# Patient Record
Sex: Male | Born: 1990 | Race: Black or African American | Hispanic: No | Marital: Single | Smoking: Current every day smoker
Health system: Southern US, Community
[De-identification: ages and names within clinical notes are randomized; demographics above are authoritative.]

---
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
## 2012-03-27 ENCOUNTER — Emergency Department (HOSPITAL_COMMUNITY): Payer: Self-pay

## 2012-03-27 ENCOUNTER — Encounter (HOSPITAL_COMMUNITY): Payer: Self-pay | Admitting: *Deleted

## 2012-03-27 ENCOUNTER — Emergency Department (HOSPITAL_COMMUNITY)
Admission: EM | Admit: 2012-03-27 | Discharge: 2012-03-27 | Disposition: A | Discharge status: Home / Self Care | Payer: Self-pay | Attending: Emergency Medicine | Admitting: Emergency Medicine

## 2012-03-27 DIAGNOSIS — R0602 Shortness of breath: Secondary | ICD-10-CM | POA: Insufficient documentation

## 2012-03-27 MED ORDER — ALBUTEROL SULFATE (5 MG/ML) 0.5% IN NEBU
2.5000 mg | INHALATION_SOLUTION | Freq: Once | RESPIRATORY_TRACT | Status: AC
  Administered 2012-03-27: 2.5 mg via RESPIRATORY_TRACT
  Filled 2012-03-27: qty 0.5

## 2012-03-27 MED ORDER — PREDNISONE 10 MG PO TABS: 20.0000 mg | ORAL_TABLET | Freq: Every day | ORAL | Status: DC

## 2012-03-27 NOTE — ED Provider Notes (Signed)
History     CSN: [id number]  Arrival date & time 03/27/12  0530   First MD Initiated Contact with Patient 03/27/12 0547      Chief Complaint  Patient presents with  . Shortness of Breath    (Consider location/radiation/quality/duration/timing/severity/associated sxs/prior treatment) HPI Nikoli L Ketelsen is a 21 y.o. male who presents to the Emergency Department complaining of  Shortness of breath that has been present for several days and is worse with exertion. He denies fever, chills, nausea, vomiting, cough. He reports he has scoliosis and has not followed up with his doctor. He feels his shortness of breath is from the scoliosis.  History reviewed. No pertinent past medical history.  History reviewed. No pertinent past surgical history.  History reviewed. No pertinent family history.  History  Substance Use Topics  . Smoking status: Not on file  . Smokeless tobacco: Not on file  . Alcohol Use: Yes      Review of Systems  Constitutional: Negative for fever.       10 Systems reviewed and are negative for acute change except as noted in the HPI.  HENT: Negative for congestion.   Eyes: Negative for discharge and redness.  Respiratory: Positive for shortness of breath. Negative for cough.   Cardiovascular: Negative for chest pain.  Gastrointestinal: Negative for vomiting and abdominal pain.  Musculoskeletal: Negative for back pain.  Skin: Negative for rash.  Neurological: Negative for syncope, numbness and headaches.  Psychiatric/Behavioral:       No behavior change.    Allergies  Review of patient's allergies indicates no known allergies.  Home Medications  No current outpatient prescriptions on file.  BP 114/77  Pulse 80  Temp 98.1 F (36.7 C) (Oral)  Resp 20  Ht 5\' 10"  (1.778 m)  Wt 130 lb (58.968 kg)  BMI 18.65 kg/m2  SpO2 97%  Physical Exam  Nursing note and vitals reviewed. Constitutional:       Awake, alert, nontoxic appearance.  HENT:    Head: Atraumatic.  Eyes: Right eye exhibits no discharge. Left eye exhibits no discharge.  Neck: Neck supple.  Pulmonary/Chest: Effort normal and breath sounds normal. No respiratory distress. He has no wheezes. He has no rales. He exhibits no tenderness.       Pulse ox on RA is 97-99%.  Abdominal: Soft. There is no tenderness. There is no rebound.  Musculoskeletal: He exhibits no tenderness.       Baseline ROM, no obvious new focal weakness.Scoliosis  Neurological:       Mental status and motor strength appears baseline for patient and situation.  Skin: No rash noted.  Psychiatric: He has a normal mood and affect.    ED Course  Procedures (including critical care time) Dg Chest 2 View  03/27/2012  *RADIOLOGY REPORT*  Clinical Data: Shortness of breath  CHEST - 2 VIEW  Comparison: None.  Findings: Normal cardiac silhouette and mediastinal contours.  No focal parenchymal opacities.  No pleural effusion or pneumothorax. Mild scoliotic curvature of the thoracolumbar spine.  No acute osseous abnormalities.  IMPRESSION: No acute cardiopulmonary disease.  Original Report Authenticated By: Waynard Reeds, M.D.    MDM  Patient here with episodes of shortness of breath without cough, fever, chest pain. Given albuterol treatment with improvement. Chest xray without  acute findings. Pt stable in ED with no significant deterioration in condition.The patient appears reasonably screened and/or stabilized for discharge and I doubt any other medical condition or other [facility] requiring further  screening, evaluation, or treatment in the ED at this time prior to discharge.  MDM Reviewed: nursing note and vitals Interpretation: x-ray           Nicoletta Dress. Colon Branch, MD 03/27/12 9604

## 2012-03-27 NOTE — ED Notes (Signed)
Patient with no complaints at this time. Respirations even and unlabored. Skin warm/dry. Discharge instructions reviewed with patient at this time. Patient given opportunity to voice concerns/ask questions. Patient discharged at this time and left Emergency Department with steady gait.   

## 2012-03-27 NOTE — Discharge Instructions (Signed)
Your xray did not show a bronchitis or pneumonia. Take all of the prednisone. Follow up with your doctor.   Shortness of Breath Shortness of breath (dyspnea) is the feeling of uneasy breathing. Shortness of breath needs care right away. HOME CARE   Do not smoke.   Avoid being around chemicals that may bother your breathing (such as paint fumes or dust).   Rest as needed. Slowly begin your usual activities.   Only take medicine as told by your doctor. Inhaled medicines or oxygen might be part of your treatment.   Follow up with your doctor as told. Waiting to do so or failure to follow up could result in worsening your condition and possible disability or death.   Understand what to do or who to call if your shortness of breath gets worse.  GET HELP RIGHT AWAY IF:   You get pain in your chest, shoulders, belly (abdomen), or jaw.   You cannot stop coughing or you start wheezing.   You cough up blood or thick mucus.   You can only speak with short words.   You have a fever.   You feel your heart racing or skipping beats.   You are not breathing better when you stop and rest.   Your condition does not improve in the time expected.   You have problems with medicines.  MAKE SURE YOU:  Understand these instructions.   Will watch your condition.   Will get help right away if you are not doing well or get worse.  Document Released: 03/16/2008 Document Revised: 09/17/2011 Document Reviewed: 08/14/2009 [facility] Patient Information 2012 [place], [place].

## 2012-03-27 NOTE — ED Notes (Signed)
Patient lying on right side in bed with eyes closed. NAD noted.

## 2012-11-01 IMAGING — CR DG CHEST 2V
2 series · 2 of 2 positions shown · non-contrast
Comparison: None.

CLINICAL DATA: Shortness of breath

CHEST - 2 VIEW

[view not recorded (1 of 2)]
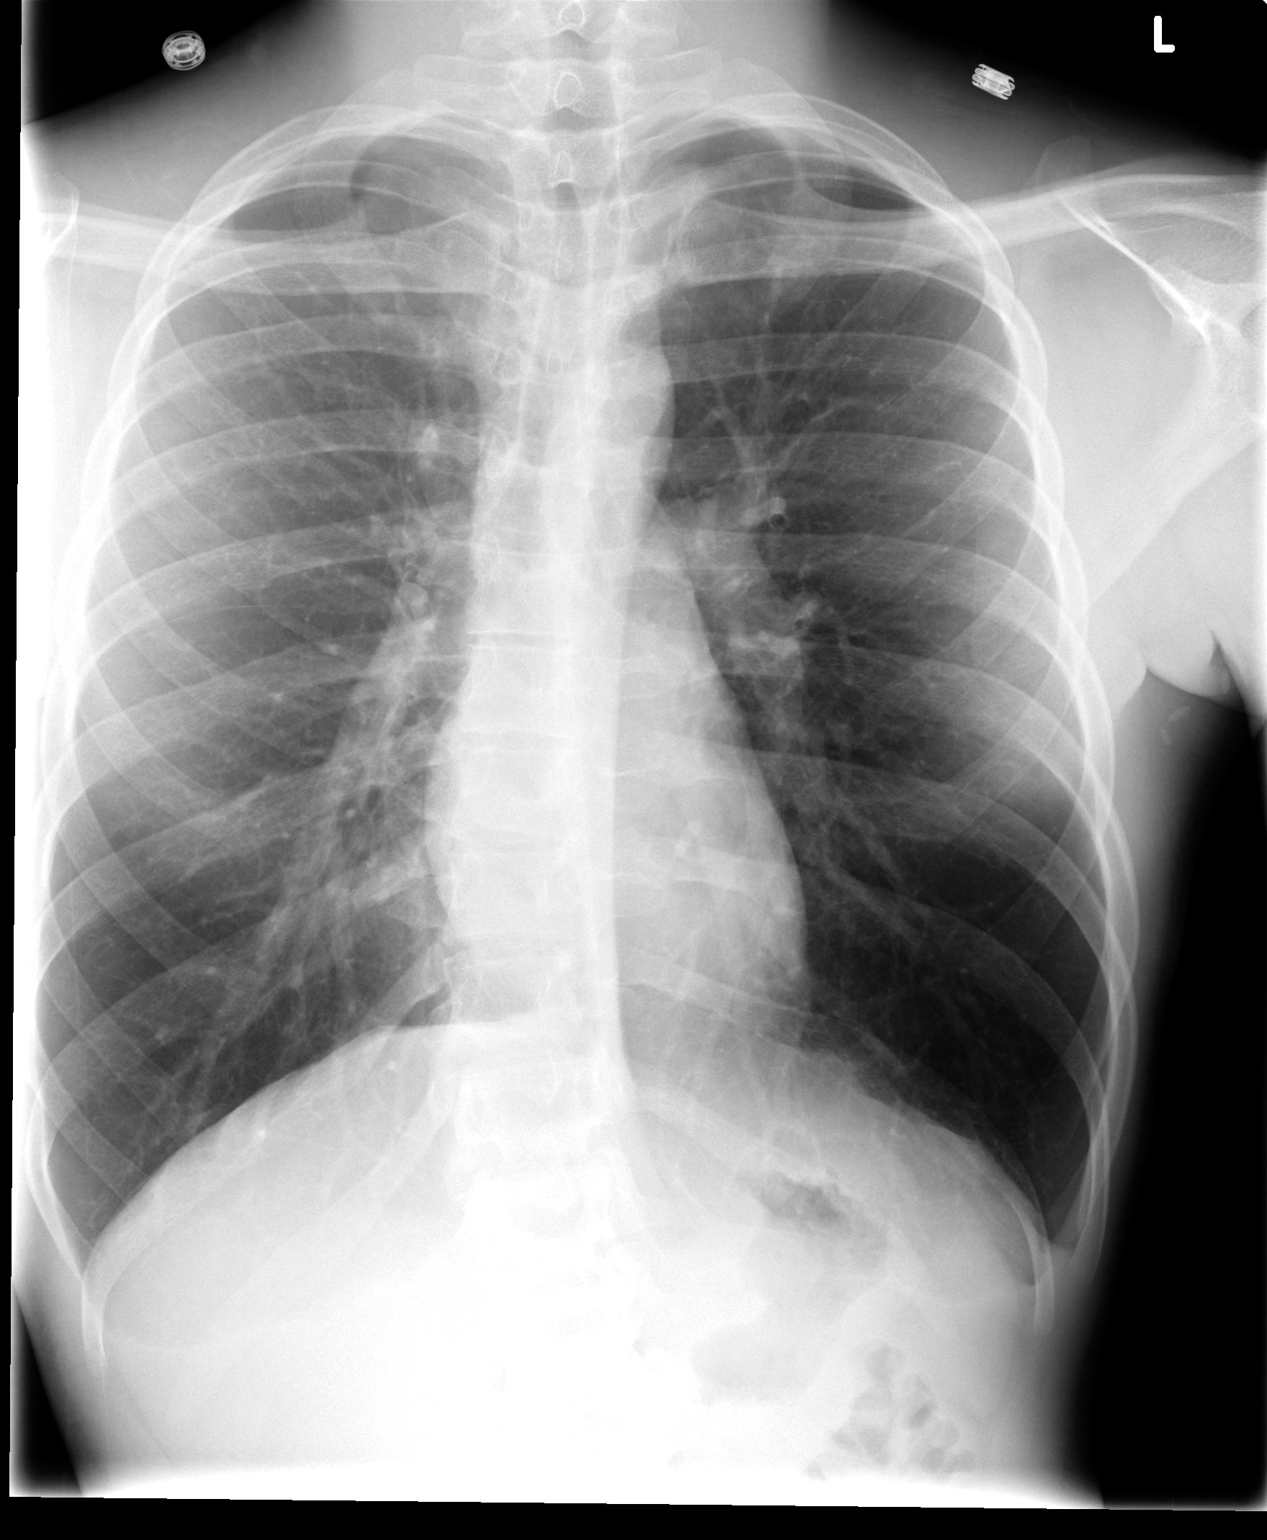

[view not recorded (2 of 2)]
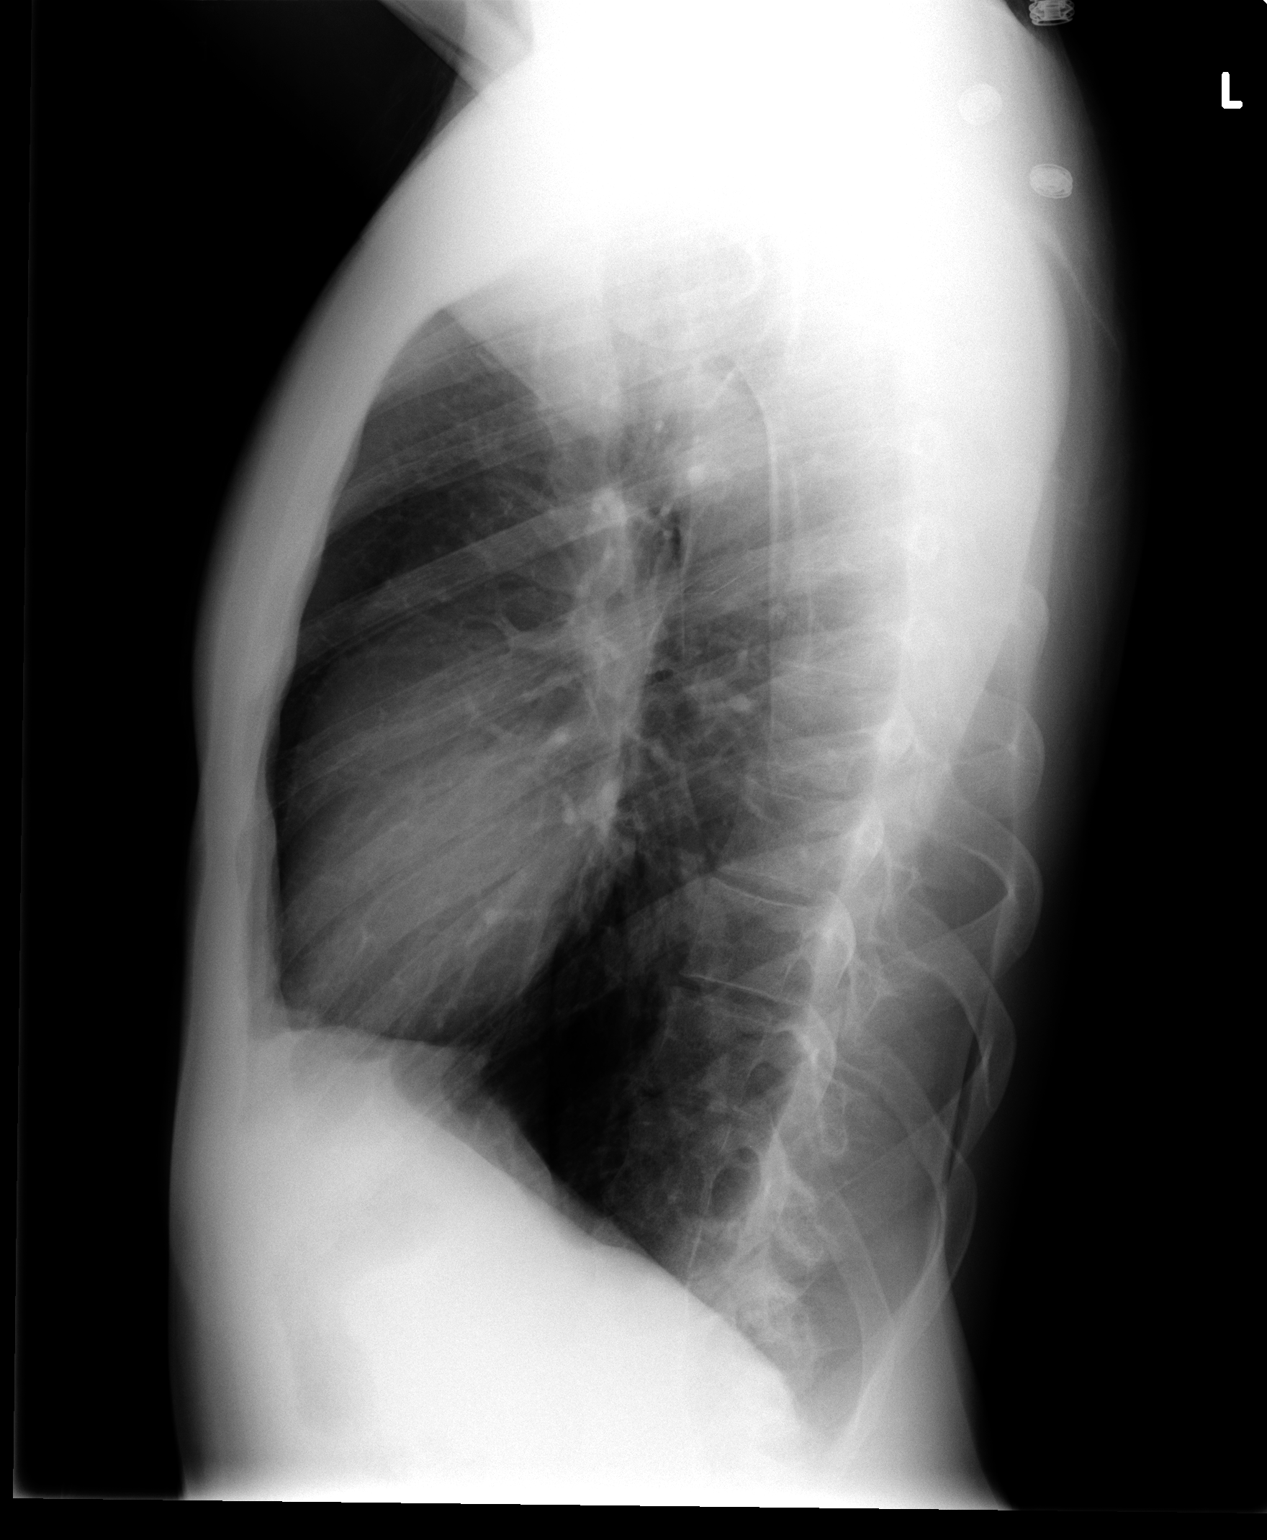

[2 of 2 positions shown; findings below may reference images not displayed]

FINDINGS: Normal cardiac silhouette and mediastinal contours.  No
focal parenchymal opacities.  No pleural effusion or pneumothorax.
Mild scoliotic curvature of the thoracolumbar spine.  No acute
osseous abnormalities.
IMPRESSION: No acute cardiopulmonary disease.

## 2016-08-05 ENCOUNTER — Encounter (HOSPITAL_COMMUNITY): Payer: Self-pay | Admitting: Emergency Medicine

## 2016-08-05 ENCOUNTER — Emergency Department (HOSPITAL_COMMUNITY)
Admission: EM | Admit: 2016-08-05 | Discharge: 2016-08-05 | Disposition: A | Discharge status: Home / Self Care | Payer: Self-pay | Attending: Emergency Medicine | Admitting: Emergency Medicine

## 2016-08-05 DIAGNOSIS — F17200 Nicotine dependence, unspecified, uncomplicated: Secondary | ICD-10-CM | POA: Insufficient documentation

## 2016-08-05 DIAGNOSIS — X500XXA Overexertion from strenuous movement or load, initial encounter: Secondary | ICD-10-CM | POA: Insufficient documentation

## 2016-08-05 DIAGNOSIS — Y990 Civilian activity done for income or pay: Secondary | ICD-10-CM | POA: Insufficient documentation

## 2016-08-05 DIAGNOSIS — M545 Low back pain: Secondary | ICD-10-CM | POA: Insufficient documentation

## 2016-08-05 DIAGNOSIS — M5489 Other dorsalgia: Secondary | ICD-10-CM

## 2016-08-05 DIAGNOSIS — Y93F2 Activity, caregiving, lifting: Secondary | ICD-10-CM | POA: Insufficient documentation

## 2016-08-05 DIAGNOSIS — Y929 Unspecified place or not applicable: Secondary | ICD-10-CM | POA: Insufficient documentation

## 2016-08-05 MED ORDER — KETOROLAC TROMETHAMINE 30 MG/ML IJ SOLN
30.0000 mg | Freq: Once | INTRAMUSCULAR | Status: AC
  Administered 2016-08-05: 30 mg via INTRAMUSCULAR
  Filled 2016-08-05: qty 1

## 2016-08-05 MED ORDER — IBUPROFEN 600 MG PO TABS: 600.0000 mg | ORAL_TABLET | Freq: Four times a day (QID) | ORAL | 0 refills | Status: DC | PRN

## 2016-08-05 NOTE — Discharge Instructions (Signed)
You are given resources for mental health and primary care provider as needed.  Your back pain is likely due to muscle strain from heavy lifting and exertional activity. Ice or apply heat packs as needed. Take ibuprofen for pain. Return without fail for worsening symptoms, including new numbness or weakness, inability to walk, or any other symptoms concerning to you.  Please also return for recurrent thoughts of wanting to hurt yourself or others, or feeling unsafe.

## 2016-08-05 NOTE — ED Provider Notes (Signed)
[facility]-EMERGENCY DEPT Provider Note   CSN: [id number] Arrival date & time: 08/05/16  1226  By signing my name below, I, Majel HomerPeyton Lee, attest that this documentation has been prepared under the direction and in the presence of Lavera Guiseana Duo Tadeusz Stahl, MD . Electronically Signed: Majel HomerPeyton Lee, Scribe. 08/05/2016. 1:50 PM.  History   Chief Complaint Chief Complaint  Patient presents with  . Back Pain   The history is provided by the patient and a friend. No language interpreter was used.   HPI Comments: Gordon Lamb is a 25 y.o. male who presents to the Emergency Department complaining of gradually worsening, right sided lower back pain that began a few days ago and worsened this morning. Per friend, pt works at a [employer] and performs a lot of heavy lifting and bending at work. She notes pt came home from work last night and "couldn't move his back" due to pain; she states pt could not move this morning either which is why he visited the ED. He states he has not taken any medication for his pain. Pt reports he has experienced chronic back pain since he was 25 years old when he was diagnosed with scoliosis. He denies fever, nausea, vomiting, chest pain, shortness of breath, numbness, urinary or bowel incontinence, urinary retention, urgency or frequency.   Pt reports hx of depression and serious SI approximately one month ago. He states he has not had similar thoughts for the past few weeks and does not have a specific plan of carrying this out. He states he does not currently see a mental health counselor for his symptoms; per friend, "pt doesn't like doctors and refuses to go."   History reviewed. No pertinent past medical history. There are no active problems to display for this patient.  History reviewed. No pertinent surgical history.  Home Medications    Prior to Admission medications   Medication Sig Start Date End Date Taking? Authorizing Provider  ibuprofen (ADVIL,MOTRIN) 600 MG  tablet Take 1 tablet (600 mg total) by mouth every 6 (six) hours as needed. 08/05/16   Lavera Guiseana Duo Kayley Zeiders, MD  predniSONE (DELTASONE) 10 MG tablet Take 2 tablets (20 mg total) by mouth daily. 03/27/12   Annamarie Dawleyerry S Strand, MD    Family History History reviewed. No pertinent family history.  Social History Social History  Substance Use Topics  . Smoking status: Current Every Day Smoker    Packs/day: 0.50  . Smokeless tobacco: Never Used  . Alcohol use Yes     Comment: occasionally   Allergies   Review of patient's allergies indicates no known allergies.  Review of Systems Review of Systems  Constitutional: Negative for fever.  Respiratory: Negative for shortness of breath.   Cardiovascular: Negative for chest pain.  Gastrointestinal: Negative for nausea and vomiting.  Genitourinary: Negative for decreased urine volume, frequency and urgency.  Musculoskeletal: Positive for back pain.  Neurological: Negative for numbness.  Psychiatric/Behavioral: Negative for suicidal ideas.  All other systems reviewed and are negative.  Physical Exam Updated Vital Signs BP 117/70 (BP Location: Left Arm)   Pulse 75   Resp 16   Ht 5\' 10"  (1.778 m)   Wt 140 lb (63.5 kg)   SpO2 100%   BMI 20.09 kg/m   Physical Exam Physical Exam  Nursing note and vitals reviewed. Constitutional: Well developed, well nourished, non-toxic, and in no acute distress Head: Normocephalic and atraumatic.  Mouth/Throat: Oropharynx is clear and moist.  Neck: Normal range of motion. Neck supple.  Cardiovascular: Normal rate and regular rhythm.   Pulmonary/Chest: Effort normal and breath sounds normal.  Abdominal: Soft. There is no tenderness. There is no rebound and no guarding.  Musculoskeletal: Normal range of motion.  Neurological: Alert, no facial droop, fluent speech, moves all extremities symmetrically. Sensation to light touch in BLE. +2 patellar and Achilles reflexes. Full strength in bilateral knee and hip with  flexion and extension and ankle dorsi and plantar flexion.  Skin: Skin is warm and dry.  Psychiatric: Cooperative  ED Treatments / Results  Labs (all labs ordered are listed, but only abnormal results are displayed) Labs Reviewed - No data to display  EKG  EKG Interpretation None       Radiology No results found.  Procedures Procedures (including critical care time)  Medications Ordered in ED Medications  ketorolac (TORADOL) 30 MG/ML injection 30 mg (30 mg Intramuscular Given 08/05/16 1427)    DIAGNOSTIC STUDIES:  Oxygen Saturation is 100% on RA, normal by my interpretation.    COORDINATION OF CARE:  1:42 PM Discussed treatment plan with pt at bedside and pt agreed to plan.  Initial Impression / Assessment and Plan / ED Course  I have reviewed the triage vital signs and the nursing notes.  Pertinent labs & imaging results that were available during my care of the patient were reviewed by me and considered in my medical decision making (see chart for details).  Clinical Course   25 year old male who presents with right sided back pain after heavy lifting at work.Presentation seems consistent with likely back strain. He is reproducible tenderness to palpation over the right paraspinal muscles. Abdomen is benign and no urinary symptoms. I do not suspect acute intra-abdominal or retroperitoneal process at this time. He is neurologically intact, without concerning features of his history or exam that would be concerning for spine trauma, infection, malignancy, or acute spinal cord process. Given toradol for pain. Discussed supportive care with NSAIDs, ice/heat packs, and avoidance of bedrest.  In regards to his depression, no current SI/HI, VAH and not responding to internal stimuli. Without evidence of acute psychosis. Denies any current serious depression. Does not require acute psych intervention or treatment on today's visit. Given outpatient mental health resources and  discussed ED return for SI/HI or feeling unsafe.   The patient appears reasonably screened and/or stabilized for discharge and I doubt any other medical condition or other [facility] requiring further screening, evaluation, or treatment in the ED at this time prior to discharge.  Strict return and follow-up instructions reviewed. He expressed understanding of all discharge instructions and felt comfortable with the plan of care.   I personally performed the services described in this documentation, which was scribed in my presence. The recorded information has been reviewed and is accurate.   Final Clinical Impressions(s) / ED Diagnoses   Final diagnoses:  Pain in right paraspinal region    New Prescriptions Discharge Medication List as of 08/05/2016  2:42 PM    START taking these medications   Details  ibuprofen (ADVIL,MOTRIN) 600 MG tablet Take 1 tablet (600 mg total) by mouth every 6 (six) hours as needed., Starting Wed 08/05/2016, Print         Lavera Guise, MD 08/05/16 [phone]

## 2016-08-05 NOTE — ED Triage Notes (Addendum)
Patient states he has MS and started having lower back pain and difficulty walking last night after work. States he does heavy lifting and bending at work. During triage, patient admits to having suicidal thoughts. States he has a long history of depression and had serious SI approximately one month ago. Denies plan at this time. Denies any serious SI within last 30 days.

## 2016-10-14 ENCOUNTER — Telehealth: Payer: Self-pay

## 2016-10-14 NOTE — Telephone Encounter (Signed)
Called pt to set up new pt appt with dr Delton Seenelson, voice mail not set up to leave message.

## 2017-11-24 ENCOUNTER — Encounter (HOSPITAL_COMMUNITY): Payer: Self-pay

## 2017-11-24 ENCOUNTER — Emergency Department (HOSPITAL_COMMUNITY)
Admission: EM | Admit: 2017-11-24 | Discharge: 2017-11-24 | Disposition: A | Discharge status: Home / Self Care | Payer: Self-pay | Attending: Emergency Medicine | Admitting: Emergency Medicine

## 2017-11-24 ENCOUNTER — Other Ambulatory Visit: Payer: Self-pay

## 2017-11-24 DIAGNOSIS — F17200 Nicotine dependence, unspecified, uncomplicated: Secondary | ICD-10-CM | POA: Insufficient documentation

## 2017-11-24 DIAGNOSIS — B349 Viral infection, unspecified: Secondary | ICD-10-CM | POA: Insufficient documentation

## 2017-11-24 MED ORDER — FLUTICASONE PROPIONATE 50 MCG/ACT NA SUSP: 2.0000 | Freq: Every day | NASAL | 0 refills | Status: DC

## 2017-11-24 NOTE — ED Provider Notes (Signed)
[facility]ENCY [facility]PART[facility] Provider Note   CSN: [id number] Arrival date & time: 11/24/17  1415     History   Chief Complaint Chief Complaint  Patient presents with  . Fever    HPI Gordon Lamb is a 27 y.o. male with no significant past medical history presents today for evaluation of subjective fevers and chills and mild nasal congestion which began yesterday.  Denies cough, sore throat, chest pain, shortness of breath, abdominal pain, nausea, or vomiting.  Has not taken his temperature but states he feels intermittently hot and cold. Endorses generalized myalgias.  Has had DayQuil and Mucinex with some relief of his symptoms.  He has known sick contacts with similar symptoms.  States he has been eating and drinking normally with good urine output.  The history is provided by the patient.    History reviewed. No pertinent past medical history.  There are no active problems to display for this patient.   History reviewed. No pertinent surgical history.     Home Medications    Prior to Admission medications   Medication Sig Start Date End Date Taking? Authorizing Provider  fluticasone (FLONASE) 50 MCG/ACT nasal spray Place 2 sprays into both nostrils daily. 11/24/17   Nguyen Butler A, PA-C  ibuprofen (ADVIL,MOTRIN) 600 MG tablet Take 1 tablet (600 mg total) by mouth every 6 (six) hours as needed. 08/05/16   Lavera GuiseLiu, Dana Duo, MD  predniSONE (DELTASONE) 10 MG tablet Take 2 tablets (20 mg total) by mouth daily. 03/27/12   Annamarie DawleyStrand, Terry S, MD    Family History No family history on file.  Social History Social History   Tobacco Use  . Smoking status: Current Every Day Smoker    Packs/day: 0.50  . Smokeless tobacco: Never Used  Substance Use Topics  . Alcohol use: Yes    Comment: occasionally  . Drug use: No     Allergies   Patient has no known allergies.   Review of Systems Review of Systems  Constitutional: Positive for chills and fever.  HENT: Positive  for congestion. Negative for sore throat.   Respiratory: Negative for cough and shortness of breath.   Cardiovascular: Negative for chest pain.  Gastrointestinal: Negative for diarrhea, nausea and vomiting.  Musculoskeletal: Positive for myalgias.  All other systems reviewed and are negative.    Physical Exam Updated Vital Signs BP 114/80   Pulse 98   Temp 99.1 F (37.3 C) (Oral)   Resp 18   Ht 5\' 10"  (1.778 m)   Wt 65.8 kg (145 lb)   SpO2 97%   BMI 20.81 kg/m   Physical Exam  Constitutional: He appears well-developed and well-nourished. No distress.  HENT:  Head: Normocephalic and atraumatic.  Right Ear: External ear normal.  Left Ear: External ear normal.  TMs without erythema or bulging bilaterally.  Nasal septum midline with mild mucosal edema bilaterally.  Posterior oropharynx with mild erythema but no tonsillar hypertrophy, exudates, or uvular deviation.  No trismus or sublingual abnormalities.  Eyes: Conjunctivae are normal. Right eye exhibits no discharge. Left eye exhibits no discharge.  Neck: Normal range of motion. Neck supple. No JVD present. No tracheal deviation present.  Cardiovascular: Normal rate, regular rhythm and normal heart sounds.  Pulmonary/Chest: Effort normal and breath sounds normal. No stridor. No respiratory distress. He has no wheezes. He has no rales.  Abdominal: Soft. Bowel sounds are normal. He exhibits no distension. There is no tenderness.  Musculoskeletal: Normal range of motion. He exhibits no  edema or tenderness.  Neurological: He is alert.  Skin: Skin is warm and dry. No erythema.  Psychiatric: He has a normal mood and affect. His behavior is normal.  Nursing note and vitals reviewed.    ED Treatments / Results  Labs (all labs ordered are listed, but only abnormal results are displayed) Labs Reviewed - No data to display  EKG  EKG Interpretation None       Radiology No results found.  Procedures Procedures (including  critical care time)  Medications Ordered in ED Medications - No data to display   Initial Impression / Assessment and Plan / ED Course  I have reviewed the triage vital signs and the nursing notes.  Pertinent labs & imaging results that were available during my care of the patient were reviewed by me and considered in my medical decision making (see chart for details).     Patient with nasal congestion and subjective fevers and chills since yesterday.  Afebrile, vital signs are stable in the ED.  Patient is nontoxic in appearance.  He has moist mucous membranes.  Lungs clear to auscultation bilaterally, doubt pneumonia or bronchitis.  No meningeal signs to suggest meningitis.  No sinus tenderness.  No evidence of strep pharyngitis or PTA.  History and physical examination consistent with viral URI.  Discussed that he is within the window to test for flu and potentially treat with Tamiflu if test is positive.  Discussed the risks and benefits of Tamiflu.  Patient elects to forego flu test and does not want Tamiflu treatment.  He prefers symptomatic treatment.  Discussed symptomatic treatment.  Discussed indications for return to the ED.  Recommend follow-up with primary care physician for reevaluation of symptoms. Pt verbalized understanding of and agreement with plan and is safe for discharge home at this time.  He has no complaints prior to discharge.  Final Clinical Impressions(s) / ED Diagnoses   Final diagnoses:  Viral illness    ED Discharge Orders        Ordered    fluticasone (FLONASE) 50 MCG/ACT nasal spray  Daily     11/24/17 1608       Jeanie Sewer, PA-C 11/24/17 1610    Doug Sou, MD 11/24/17 2324

## 2017-11-24 NOTE — ED Triage Notes (Signed)
Patient reports of fever x1 day. Denies cough/sore throat.

## 2017-11-24 NOTE — Discharge Instructions (Signed)
Drink plenty of clear fluids and get plenty of rest. Gargle warm salt water and spit it out for sore throat. May also use cough drops, warm teas, etc. Take flonase to decrease nasal congestion.  You may take over-the-counter allergy medication for nasal congestion and scratchy throat. Alternate 600 mg of ibuprofen and [phone] mg of Tylenol every 3 hours as needed for pain/fever. Do not exceed 4000 mg of Tylenol daily.   Followup with your primary care doctor in 5-7 days for recheck of ongoing symptoms. Return to emergency department for emergent changing or worsening of symptoms such as throat tightness, facial swelling, fever not controlled by ibuprofen or Tylenol,difficulty breathing, or chest pain.
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
## 2018-08-09 ENCOUNTER — Encounter (HOSPITAL_COMMUNITY): Payer: Self-pay | Admitting: Psychiatry

## 2018-08-09 ENCOUNTER — Ambulatory Visit (INDEPENDENT_AMBULATORY_CARE_PROVIDER_SITE_OTHER): Payer: No Typology Code available for payment source | Admitting: Psychiatry

## 2018-08-09 ENCOUNTER — Encounter (INDEPENDENT_AMBULATORY_CARE_PROVIDER_SITE_OTHER): Payer: Self-pay

## 2018-08-09 VITALS — BP 123/86 | HR 76 | Ht 70.0 in | Wt 165.0 lb

## 2018-08-09 DIAGNOSIS — F331 Major depressive disorder, recurrent, moderate: Secondary | ICD-10-CM | POA: Diagnosis not present

## 2018-08-09 DIAGNOSIS — F419 Anxiety disorder, unspecified: Secondary | ICD-10-CM | POA: Diagnosis not present

## 2018-08-09 DIAGNOSIS — F649 Gender identity disorder, unspecified: Secondary | ICD-10-CM

## 2018-08-09 DIAGNOSIS — G4700 Insomnia, unspecified: Secondary | ICD-10-CM | POA: Diagnosis not present

## 2018-08-09 MED ORDER — SERTRALINE HCL 50 MG PO TABS: ORAL_TABLET | ORAL | 0 refills | Status: DC
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
## 2018-08-09 NOTE — Patient Instructions (Addendum)
1. Start sertraline 25 mg daily for one week, 50 mg daily  2. Check TSH (ask your primary care doctor) 3. Return to clinic in one month for 30 mins  CONTACT INFORMATION  What to do if you need to get in touch with someone regarding a psychiatric issue:  1. EMERGENCY: For psychiatric emergencies (if you are suicidal or if there are any other safety issues) call 911 and/or go to your nearest Emergency Room immediately.   2. IF YOU NEED SOMEONE TO TALK TO RIGHT NOW: Given my clinical responsibilities, I may not be able to speak with you over the phone for a prolonged period of time.  a. You may always call The National Suicide Prevention Lifeline at 1-800-273-TALK 970-375-7558).  b. Your county of residence will also have local crisis services. For [facility]: [facility] at [phone] (24 Hour Crisis Hotline)

## 2018-08-16 ENCOUNTER — Encounter: Payer: Self-pay | Admitting: Orthopedic Surgery
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
## 2018-09-13 ENCOUNTER — Ambulatory Visit (INDEPENDENT_AMBULATORY_CARE_PROVIDER_SITE_OTHER): Payer: No Typology Code available for payment source | Admitting: Psychiatry

## 2018-09-13 VITALS — BP 120/82 | HR 80 | Ht 70.0 in | Wt 168.0 lb

## 2018-09-13 DIAGNOSIS — F649 Gender identity disorder, unspecified: Secondary | ICD-10-CM

## 2018-09-13 DIAGNOSIS — F330 Major depressive disorder, recurrent, mild: Secondary | ICD-10-CM

## 2018-09-13 MED ORDER — SERTRALINE HCL 100 MG PO TABS: 100.0000 mg | ORAL_TABLET | Freq: Every day | ORAL | 0 refills | Status: DC

## 2018-09-13 NOTE — Patient Instructions (Signed)
1. Increase sertraline 100 mg daily  2. Check TSH  3. Return to clinic in three months for 30 mins 4. Referral to therapy

## 2018-09-14 LAB — TSH: TSH: 2.08 m[IU]/L (ref 0.40–4.50)

## 2018-10-21 ENCOUNTER — Emergency Department (HOSPITAL_COMMUNITY)
Admission: EM | Admit: 2018-10-21 | Discharge: 2018-10-21 | Disposition: A | Discharge status: Home / Self Care | Payer: No Typology Code available for payment source | Attending: Emergency Medicine | Admitting: Emergency Medicine

## 2018-10-21 ENCOUNTER — Other Ambulatory Visit: Payer: Self-pay

## 2018-10-21 ENCOUNTER — Encounter (HOSPITAL_COMMUNITY): Payer: Self-pay | Admitting: *Deleted

## 2018-10-21 DIAGNOSIS — Z79899 Other long term (current) drug therapy: Secondary | ICD-10-CM | POA: Insufficient documentation

## 2018-10-21 DIAGNOSIS — F17200 Nicotine dependence, unspecified, uncomplicated: Secondary | ICD-10-CM | POA: Diagnosis not present

## 2018-10-21 DIAGNOSIS — K0889 Other specified disorders of teeth and supporting structures: Secondary | ICD-10-CM | POA: Diagnosis present

## 2018-10-21 DIAGNOSIS — F329 Major depressive disorder, single episode, unspecified: Secondary | ICD-10-CM | POA: Insufficient documentation

## 2018-10-21 DIAGNOSIS — K029 Dental caries, unspecified: Secondary | ICD-10-CM

## 2018-10-21 MED ORDER — PENICILLIN V POTASSIUM 500 MG PO TABS: 500.0000 mg | ORAL_TABLET | Freq: Four times a day (QID) | ORAL | 0 refills | Status: AC

## 2018-10-21 MED ORDER — IBUPROFEN 600 MG PO TABS: 600.0000 mg | ORAL_TABLET | Freq: Four times a day (QID) | ORAL | 0 refills | Status: AC | PRN

## 2018-10-21 NOTE — Discharge Instructions (Signed)
Take Penicillin 4 times daily for the next week Take Ibuprofen as directed for the next week Please follow up with a dentist - a referral has been provided as well as a Writer guide which has several low cost providers listed Return if worsening

## 2018-10-21 NOTE — ED Triage Notes (Signed)
Pt c/o toothache on upper and lower right side x couple of weeks, worsening today.

## 2018-10-21 NOTE — ED Provider Notes (Signed)
[facility] EMERGENCY DEPARTMENT Provider Note   CSN: [id number] Arrival date & time: 10/21/18  1541     History   Chief Complaint Chief Complaint  Patient presents with  . Dental Pain    HPI Gordon Lamb is a 28 y.o. male who presents with dental pain.  Patient states that he has had severe dental pain over the right upper and lower teeth for the past day but has had issues with the teeth for weeks.  He saw a dentist on Monday for a cleaning however they were not really able to complete a full work-up because he did not have the ability to pay for x-rays and any procedures.  He has been taking over-the-counter medicine without significant relief.  He denies fever or significant jaw swelling.  He has been able to swallow.  HPI  History reviewed. No pertinent past medical history.  Patient Active Problem List   Diagnosis Date Noted  . MDD (major depressive disorder), recurrent episode, moderate (HCC) 08/09/2018  . Gender dysphoria 08/09/2018    History reviewed. No pertinent surgical history.      Home Medications    Prior to Admission medications   Medication Sig Start Date End Date Taking? Authorizing Provider  ibuprofen (ADVIL,MOTRIN) 600 MG tablet Take 1 tablet (600 mg total) by mouth every 6 (six) hours as needed. 08/05/16   Lavera Guise, MD  sertraline (ZOLOFT) 100 MG tablet Take 1 tablet (100 mg total) by mouth daily. 09/13/18   Neysa Hotter, MD    Family History No family history on file.  Social History Social History   Tobacco Use  . Smoking status: Current Every Day Smoker    Packs/day: 0.50  . Smokeless tobacco: Never Used  Substance Use Topics  . Alcohol use: Yes    Comment: occasionally  . Drug use: No     Allergies   Patient has no known allergies.   Review of Systems Review of Systems  Constitutional: Negative for fever.  HENT: Positive for dental problem.      Physical Exam Updated Vital Signs BP 118/88 (BP Location: Left  Arm)   Pulse 78   Temp 97.8 F (36.6 C) (Oral)   Resp 16   Ht 5\' 9"  (1.753 m)   Wt 72.6 kg   SpO2 99%   BMI 23.63 kg/m   Physical Exam Vitals signs and nursing note reviewed.  Constitutional:      General: He is not in acute distress.    Appearance: Normal appearance. He is well-developed.     Comments: Calm and cooperative  HENT:     Head: Normocephalic and atraumatic.     Mouth/Throat:     Lips: Pink.     Mouth: Mucous membranes are moist.     Dentition: Abnormal dentition (widespread dental caries). Dental caries present. No dental tenderness or dental abscesses.     Tongue: No lesions.     Palate: No mass.     Pharynx: Oropharynx is clear.     Comments: Permanent retainer over bottom incisors Eyes:     General: No scleral icterus.       Right eye: No discharge.        Left eye: No discharge.     Conjunctiva/sclera: Conjunctivae normal.     Pupils: Pupils are equal, round, and reactive to light.  Neck:     Musculoskeletal: Normal range of motion.  Cardiovascular:     Rate and Rhythm: Normal rate.  Pulmonary:  Effort: Pulmonary effort is normal. No respiratory distress.  Abdominal:     General: There is no distension.  Skin:    General: Skin is warm and dry.  Neurological:     Mental Status: He is alert and oriented to person, place, and time.  Psychiatric:        Behavior: Behavior normal.      ED Treatments / Results  Labs (all labs ordered are listed, but only abnormal results are displayed) Labs Reviewed - No data to display  EKG None  Radiology No results found.  Procedures Procedures (including critical care time)  Medications Ordered in ED Medications - No data to display   Initial Impression / Assessment and Plan / ED Course  I have reviewed the triage vital signs and the nursing notes.  Pertinent labs & imaging results that were available during my care of the patient were reviewed by me and considered in my medical decision making  (see chart for details).  Dental pain associated with dental caries and possible dental infection. Patient is afebrile, non toxic appearing, and swallowing secretions well. No concerning findings on exam. No obvious abscess and doubt deep space head or neck infection.  I gave patient referral to dentist and stressed the importance of dental follow up for ultimate management of dental pain. Will attempt to treat with antibiotic and Ibuprofen. Discussed findings, treatment, and follow up  with patient.     Final Clinical Impressions(s) / ED Diagnoses   Final diagnoses:  Dental caries    ED Discharge Orders    None       Bethel BornGekas, Kinga Cassar Marie, PA-C 10/21/18 1734    Donnetta Hutchingook, Brian, MD 10/21/18 1945
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
## 2018-12-13 ENCOUNTER — Encounter (INDEPENDENT_AMBULATORY_CARE_PROVIDER_SITE_OTHER): Payer: Self-pay

## 2018-12-13 ENCOUNTER — Ambulatory Visit (INDEPENDENT_AMBULATORY_CARE_PROVIDER_SITE_OTHER): Payer: No Typology Code available for payment source | Admitting: Psychiatry

## 2018-12-13 VITALS — BP 114/73 | HR 78 | Ht 69.0 in | Wt 170.0 lb

## 2018-12-13 DIAGNOSIS — F649 Gender identity disorder, unspecified: Secondary | ICD-10-CM | POA: Diagnosis not present

## 2018-12-13 DIAGNOSIS — F330 Major depressive disorder, recurrent, mild: Secondary | ICD-10-CM | POA: Diagnosis not present

## 2018-12-13 MED ORDER — SERTRALINE HCL 100 MG PO TABS: 150.0000 mg | ORAL_TABLET | Freq: Every day | ORAL | 0 refills | Status: DC

## 2018-12-13 NOTE — Patient Instructions (Signed)
1. Increase sertraline 150 mg daily  2. Return to clinic in three months for 30 mins 3. Referral to therapy

## 2019-01-31 ENCOUNTER — Ambulatory Visit (HOSPITAL_COMMUNITY): Payer: No Typology Code available for payment source | Admitting: Psychiatry

## 2019-01-31 ENCOUNTER — Other Ambulatory Visit: Payer: Self-pay
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
## 2019-03-15 ENCOUNTER — Ambulatory Visit (INDEPENDENT_AMBULATORY_CARE_PROVIDER_SITE_OTHER): Payer: Self-pay | Admitting: Psychiatry

## 2019-03-15 ENCOUNTER — Other Ambulatory Visit: Payer: Self-pay

## 2019-03-15 ENCOUNTER — Encounter (HOSPITAL_COMMUNITY): Payer: Self-pay | Admitting: Psychiatry

## 2019-03-15 DIAGNOSIS — F649 Gender identity disorder, unspecified: Secondary | ICD-10-CM

## 2019-03-15 DIAGNOSIS — F330 Major depressive disorder, recurrent, mild: Secondary | ICD-10-CM

## 2019-03-15 MED ORDER — SERTRALINE HCL 100 MG PO TABS: 150.0000 mg | ORAL_TABLET | Freq: Every day | ORAL | 0 refills | Status: DC

## 2019-03-15 NOTE — Patient Instructions (Signed)
1. Continue sertraline 150 mg daily  2. Next appointment: 8/28 at 10:30

## 2019-04-10 ENCOUNTER — Telehealth (HOSPITAL_COMMUNITY): Payer: Self-pay | Admitting: Psychiatry

## 2019-04-10 ENCOUNTER — Encounter (HOSPITAL_COMMUNITY): Payer: Self-pay | Admitting: Psychiatry

## 2019-04-10 NOTE — Telephone Encounter (Signed)
PATIENT HAS A VOICE MAIL BOX THAT IS NOT SET-UP

## 2019-04-10 NOTE — Telephone Encounter (Signed)
Could you inform him to try the therapist below? Hope the location/insurance works for him. If it does not, make referral to therapists inside our clinic.   Carol Ada, [place] , [phone] [address] [place], [place] [address]

## 2019-04-10 NOTE — Telephone Encounter (Signed)
Will send a letter then, thanks.
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
## 2019-06-09 ENCOUNTER — Other Ambulatory Visit: Payer: Self-pay

## 2019-06-09 ENCOUNTER — Encounter (HOSPITAL_COMMUNITY): Payer: Self-pay | Admitting: Psychiatry

## 2019-06-09 ENCOUNTER — Telehealth (HOSPITAL_COMMUNITY): Payer: Self-pay | Admitting: Psychiatry

## 2019-06-09 ENCOUNTER — Ambulatory Visit (INDEPENDENT_AMBULATORY_CARE_PROVIDER_SITE_OTHER): Payer: No Typology Code available for payment source | Admitting: Psychiatry

## 2019-06-09 DIAGNOSIS — F649 Gender identity disorder, unspecified: Secondary | ICD-10-CM | POA: Diagnosis not present

## 2019-06-09 DIAGNOSIS — F3341 Major depressive disorder, recurrent, in partial remission: Secondary | ICD-10-CM

## 2019-06-09 MED ORDER — SERTRALINE HCL 100 MG PO TABS: 150.0000 mg | ORAL_TABLET | Freq: Every day | ORAL | 0 refills | Status: AC

## 2019-06-09 NOTE — Telephone Encounter (Signed)
Sent link for video visit through Doxy me. Patient did not sign in. Called the patient  twice for appointment scheduled today. The patient did not answer the phone. Left voice message to contact the office.  

## 2019-06-09 NOTE — Patient Instructions (Signed)
1. Continue sertraline 150 mg daily 2.Next appointment: 11/6 at 10:30 for 30 mins, video 3. Consider contacting her for therapy  Carol Ada, [place] , [phone] [address], [place], [place] [address]
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
## 2019-08-18 ENCOUNTER — Other Ambulatory Visit: Payer: Self-pay

## 2019-08-18 ENCOUNTER — Telehealth (HOSPITAL_COMMUNITY): Payer: Self-pay | Admitting: Psychiatry

## 2019-08-18 ENCOUNTER — Ambulatory Visit (HOSPITAL_COMMUNITY): Payer: No Typology Code available for payment source | Admitting: Psychiatry

## 2019-08-18 NOTE — Telephone Encounter (Signed)
Sent link for video visit through [employer]oxy me. Patient did not sign in. Called the patient  twice for appointment scheduled today. The patient did not answer the phone. No option to leave a voice message. 

## 2020-11-04 ENCOUNTER — Other Ambulatory Visit: Payer: No Typology Code available for payment source

## 2020-11-04 ENCOUNTER — Other Ambulatory Visit: Payer: Self-pay | Admitting: Internal Medicine

## 2020-11-04 DIAGNOSIS — Z20822 Contact with and (suspected) exposure to covid-19: Secondary | ICD-10-CM

## 2020-11-05 LAB — SPECIMEN STATUS REPORT

## 2020-11-05 LAB — NOVEL CORONAVIRUS, NAA: SARS-CoV-2, NAA: NOT DETECTED

## 2020-11-05 LAB — SARS-COV-2, NAA 2 DAY TAT
